# Patient Record
Sex: Female | Born: 1985 | Race: White | Hispanic: No | Marital: Married | Smoking: Never smoker
Health system: Southern US, Community
[De-identification: ages and names within clinical notes are randomized; demographics above are authoritative.]

## PROBLEM LIST (undated history)

## (undated) DIAGNOSIS — T7840XA Allergy, unspecified, initial encounter: Secondary | ICD-10-CM

## (undated) DIAGNOSIS — M549 Dorsalgia, unspecified: Secondary | ICD-10-CM

## (undated) HISTORY — DX: Allergy, unspecified, initial encounter: T78.40XA

---
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
## 2006-06-26 ENCOUNTER — Other Ambulatory Visit: Admission: RE | Admit: 2006-06-26 | Discharge: 2006-06-26 | Payer: Self-pay | Admitting: Obstetrics and Gynecology

## 2006-08-22 ENCOUNTER — Ambulatory Visit (HOSPITAL_COMMUNITY): Admission: RE | Admit: 2006-08-22 | Discharge: 2006-08-22 | Payer: Self-pay | Admitting: Obstetrics and Gynecology

## 2006-12-26 ENCOUNTER — Encounter (INDEPENDENT_AMBULATORY_CARE_PROVIDER_SITE_OTHER): Payer: Self-pay | Admitting: Obstetrics and Gynecology

## 2006-12-26 ENCOUNTER — Inpatient Hospital Stay (HOSPITAL_COMMUNITY): Admission: RE | Admit: 2006-12-26 | Discharge: 2006-12-29 | Payer: Self-pay | Admitting: Obstetrics and Gynecology

## 2007-02-06 ENCOUNTER — Other Ambulatory Visit: Admission: RE | Admit: 2007-02-06 | Discharge: 2007-02-06 | Payer: Self-pay | Admitting: Obstetrics and Gynecology

## 2010-09-04 ENCOUNTER — Emergency Department (HOSPITAL_COMMUNITY)
Admission: EM | Admit: 2010-09-04 | Discharge: 2010-09-04 | Disposition: A | Discharge status: Home / Self Care | Payer: Self-pay | Attending: Emergency Medicine | Admitting: Emergency Medicine

## 2010-09-04 DIAGNOSIS — M5380 Other specified dorsopathies, site unspecified: Secondary | ICD-10-CM | POA: Insufficient documentation

## 2010-09-04 DIAGNOSIS — T148XXA Other injury of unspecified body region, initial encounter: Secondary | ICD-10-CM | POA: Insufficient documentation

## 2010-09-04 DIAGNOSIS — X58XXXA Exposure to other specified factors, initial encounter: Secondary | ICD-10-CM | POA: Insufficient documentation

## 2010-09-04 NOTE — H&P (Signed)
Marie Gibbs, Marie Gibbs                  ACCOUNT NO.:  [id number]   MEDICAL RECORD NO.:  [id number]          PATIENT TYPE:  INP   LOCATION:  NA                            [facility]   PHYSICIAN:  James A. Ashley Royalty, M.D.DATE OF BIRTH:  Sep 04, 1985   DATE OF ADMISSION:  DATE OF DISCHARGE:                              HISTORY & PHYSICAL   HISTORY OF PRESENT ILLNESS:  This is a 25 year old prima gravida, EDC  12/16/06, currently 41 weeks 3 days gestation. Prenatal care has been  essentially uncomplicated. The patient had an ultrasound done 4/08,  revealing an estimated fetal weight of 4682 grams, 97th percentile. Most  recent cervical examination revealed her to be closed. After discussion  of the risks and benefits of vaginal delivery, including the possibility  of shoulder dystocia as well as the risk and benefits of primary  Cesarean section, the patient states a desire for primary Cesarean  section due to the LGA infant.   MEDICATIONS:  Vitamins.   PAST MEDICAL HISTORY:  Medical: Negative. Surgical: Myringotomy as a  child.   ALLERGIES:  NONE.   FAMILY HISTORY:  Noncontributory.   SOCIAL HISTORY:  The patient denies using tobacco or significant  alcohol.   REVIEW OF SYSTEMS:  Noncontributory.   PHYSICAL EXAMINATION:  GENERAL APPEARANCE: Well-developed, well-  nourished, pleasant white female in no acute distress.  VITAL SIGNS: Febrile. Vital signs stable.  CHEST: Lungs are clear.  CARDIAC: Regular rate and rhythm.  BREASTS: Exam deferred.  ABDOMEN: The abdomen is gravid with a term fundal height. Fetal heart  tones are auscultated.  MUSCULOSKELETAL: No significant edema.  PELVIC: Examination deferred.   IMPRESSION:  1. Intrauterine pregnancy at 41 weeks 3 days gestation.  2. Large for gestational age infant with an estimated fetal weight of      4682 grams on recent ultrasound.   PLAN:  Primary Cesarean section. Risks, benefits, complications and  alternatives were  fully discussed with the patient. She states she  understands and accepts. Questions invited and answered.      James A. Ashley Royalty, M.D.  Electronically Signed     JAM/MEDQ  D:  12/26/2006  T:  12/26/2006  Job:  [id number]

## 2010-09-04 NOTE — Op Note (Signed)
Marie Gibbs, Marie Gibbs                  ACCOUNT NO.:  [id number]   MEDICAL RECORD NO.:  [id number]          PATIENT TYPE:  INP   LOCATION:  9199                          [facility]ILITY[facility]  [facility]   PHYSICIAN:  James A. Ashley Royalty, M.D.DATE OF BIRTH:  05/02/1985   DATE OF PROCEDURE:  12/26/2006  DATE OF DISCHARGE:                               OPERATIVE REPORT   PREOPERATIVE DIAGNOSIS:  1. Intrauterine pregnancy at 41 weeks 3 days gestation.  2. Large for gestational age infant.   POSTOPERATIVE DIAGNOSES:  1. Intrauterine pregnancy at 41 weeks 3 days gestation.  2. Large for gestational age infant.  3. Path pending.   PROCEDURE:  Primary low transverse cesarean section.   SURGEON:  Rudy Jew. Ashley Royalty, M.D.   ANESTHESIA:  Spinal   FINDINGS:  9 pound 6 ounce female, Apgars 8 at one minute and 9 at five  minutes.  Sent to the newborn nursery   ESTIMATED BLOOD LOSS:  800 mL.   COMPLICATIONS:  None.   PACKS AND DRAINS:  Foley.   SPONGE NEEDLE AND INSTRUMENT REPORTED:  As correct x2.   DESCRIPTION OF PROCEDURE:  The patient was taken to the operating room,  and placed in the dorsal supine position after spinal anesthetic  administered.  She was prepped and draped in the usual manner for  abdominal surgery.  Foley catheter was placed.   A Pfannenstiel incision was made down to the level of the fascia which  was nicked with a knife and incised transversely with Mayo scissors.  The underlying rectus muscles were separated from the fascia using sharp  and blunt dissection.  The rectus muscles were separated in the midline  exposing the peritoneum which was elevated and entered atraumatically  with Metzenbaum scissors.  The incision was extended longitudinally.  The uterus was identified, and a bladder flap created by incising the  anterior uterine serosa and sharply and bluntly dissecting the bladder  inferiorly.  It was held in place with the bladder blade.   The uterus was entered in through a  low transverse incision using sharp  and blunt dissection.  The fluid was noted to be clear.  The infant was  delivered from the vertex presentation in an atraumatic manner.  The  infant was suctioned.  The cord was doubly clamped, cut, and the infant  given immediately to the awaiting pediatrics team.  Cord blood was  obtained; and placenta and membranes were removed in their entirety, and  submitted to pathology for histologic studies.  The uterus was  exteriorized.   The uterus was then closed in two running layers of #1 Vicryl.  The  first was a running locking layer.  The second was a running,  intermittently locking, and imbricating layer.  Hemostasis was noted.  The bladder flap was closed with 3-0 Vicryl in a running fashion.  Uterus, tubes and ovaries were inspected and found to be normal.  They  were returned to the abdominal cavity.  Copious irrigation was  accomplished.  Hemostasis was noted.   Next the peritoneum was closed with 3-0 Vicryl in  a running fashion.  The fascia was closed with #0 Vicryl in a running fashion.  The skin was  closed with staples.  The patient tolerated the procedure extremely well  and was returned to the recovery room in good condition.      James A. Ashley Royalty, M.D.  Electronically Signed     JAM/MEDQ  D:  12/26/2006  T:  12/26/2006  Job:  [id number]

## 2010-09-04 NOTE — H&P (Signed)
Marie Gibbs, Marie Gibbs                  ACCOUNT NO.:  [id number]   MEDICAL RECORD NO.:  [id number]          PATIENT TYPE:  INP   LOCATION:                                 FACILITY:   PHYSICIAN:  James A. Ashley Royalty, M.D.DATE OF BIRTH:  10/23/1985   DATE OF ADMISSION:  12/26/2006  DATE OF DISCHARGE:                              HISTORY & PHYSICAL   This is a 25 year old primigravida, EDC 12/16/06, approximately 41 weeks  2 days gestation.  Prenatal care has been essentially uncomplicated. The  patient had an ultrasound performed at [facility]   Dictation ends at this point.      James A. Ashley Royalty, M.D.     JAM/MEDQ  D:  12/26/2006  T:  12/26/2006  Job:  [id number]

## 2010-09-07 NOTE — Discharge Summary (Signed)
[name], Marie Gibbs                  ACCOUNT NO.:  [id number]   MEDICAL RECORD NO.:  [id number]          PATIENT TYPE:  INP   LOCATION:  9103                          [facility]   PHYSICIAN:  James A. Ashley Royalty, M.D.DATE OF BIRTH:  Sep 24, 1985   DATE OF ADMISSION:  12/26/2006  DATE OF DISCHARGE:  12/29/2006                               DISCHARGE SUMMARY   DISCHARGE DIAGNOSES:  1. Intrauterine pregnancy at 41 weeks 3 days gestation.  2. Large for gestational age infant.  3. Postoperative anemia - asymptomatic.   OPERATIONS/PROCEDURES:  Primary low transverse cesarean section   CONSULTATIONS:  None.   DISCHARGE MEDICATIONS:  1. Percocet.  2. Motrin.  3. Chromagen.   HISTORY/PHYSICAL:  This is a 25 year old primigravida at 41 weeks 3 days  gestation.  Prenatal care was essentially uncomplicated.  She did have  an ultrasound performed recently revealing an estimated fetal weight of  4682 grams, 97th percentile.  After discussion of the risk and benefits  of vaginal delivery versus primary cesarean section, the patient elected  to go with the cesarean section due to the paramount concerns regarding  shoulder dystocia.  For the remaining of the history and physical,  please see chart.   HOSPITAL COURSE:  The patient was admitted to [facility] in  [place].  Initial laboratory studies were drawn.  She was taken to  the operating room on December 26, 2006 and underwent primary low  transverse cesarean section.  The procedure was accomplished by Dr.  Sylvester Harder.  It yielded a 9 pound 6 ounce female.  Apgars of 8 at 1  minute and 9 at 5 minutes, and sent to the newborn nursery.  There were  no complications.  The patient's postoperative course was characterized  only by asymptomatic anemia.  The patient was felt to be a candidate for  discharge on December 29, 2006 and was discharged home afebrile and in  satisfactory condition.      James A. Ashley Royalty, M.D.  Electronically Signed     JAM/MEDQ  D:  02/24/2007  T:  02/25/2007  Job:  [id number]

## 2011-02-01 LAB — CBC
HCT: 22.7 — ABNORMAL LOW
HCT: 25.2 — ABNORMAL LOW
HCT: 36.8
Hemoglobin: 12.6
MCHC: 34.2
MCHC: 34.3
MCV: 90
MCV: 90.1
MCV: 91.4
Platelets: 131 — ABNORMAL LOW
Platelets: 134 — ABNORMAL LOW
Platelets: 163
RBC: 4.08
RDW: 14
RDW: 14.2 — ABNORMAL HIGH
WBC: 11.6 — ABNORMAL HIGH
WBC: 12.5 — ABNORMAL HIGH

## 2011-02-01 LAB — TYPE AND SCREEN: ABO/RH(D): O POS

## 2011-02-01 LAB — HEMATOCRIT: HCT: 25.5 — ABNORMAL LOW

## 2011-02-01 LAB — RPR: RPR Ser Ql: NONREACTIVE

## 2011-02-24 ENCOUNTER — Encounter: Payer: Self-pay | Admitting: *Deleted

## 2011-02-24 ENCOUNTER — Emergency Department (HOSPITAL_COMMUNITY)
Admission: EM | Admit: 2011-02-24 | Discharge: 2011-02-25 | Discharge status: Against Medical Advice | Payer: Self-pay | Attending: Emergency Medicine | Admitting: Emergency Medicine

## 2011-02-24 DIAGNOSIS — R109 Unspecified abdominal pain: Secondary | ICD-10-CM | POA: Insufficient documentation

## 2011-02-24 LAB — URINALYSIS, ROUTINE W REFLEX MICROSCOPIC
Glucose, UA: NEGATIVE mg/dL
Ketones, ur: NEGATIVE mg/dL
Protein, ur: 100 mg/dL — AB

## 2011-02-24 LAB — URINE MICROSCOPIC-ADD ON

## 2011-02-24 NOTE — ED Notes (Signed)
C/o rt flank pain since last pm nauseated no diarrhea.  Chills.  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

## 2012-10-26 ENCOUNTER — Ambulatory Visit: Payer: 59 | Admitting: Family Medicine

## 2012-10-26 VITALS — BP 118/66 | HR 89 | Temp 98.2°F | Resp 18 | Ht 71.0 in | Wt 162.4 lb

## 2012-10-26 DIAGNOSIS — J029 Acute pharyngitis, unspecified: Secondary | ICD-10-CM

## 2012-10-26 LAB — POCT RAPID STREP A (OFFICE): Rapid Strep A Screen: NEGATIVE

## 2012-10-26 MED ORDER — CEFDINIR 300 MG PO CAPS: ORAL_CAPSULE | ORAL | Status: DC

## 2012-10-26 NOTE — Progress Notes (Signed)
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  No fever or cough.  Bifrontal headache, steady with some throbbing and occasional sharp pains.  Neck is sore in the back like muscle tension.  Has tried benadryl and ibuprofen for discomfort.  Objective:  NAD Oroph:  Very red posterior pharynx without  Exudates TM's:  Normal Neck: supple, no adenop Skin: no rash Results for orders placed in visit on 10/26/12  POCT RAPID STREP A (OFFICE)      Result Value Range   Rapid Strep A Screen Negative  Negative   Assessment:  Pharyngitis  Plan:

## 2012-10-26 NOTE — Patient Instructions (Addendum)
Strep Throat  Strep throat is an infection of the throat caused by a bacteria named Streptococcus pyogenes. Your caregiver may call the infection streptococcal "tonsillitis" or "pharyngitis" depending on whether there are signs of inflammation in the tonsils or back of the throat. Strep throat is most common in children aged 27 15 years during the cold months of the year, but it can occur in people of any age during any season. This infection is spread from person to person (contagious) through coughing, sneezing, or other close contact.  SYMPTOMS   · Fever or chills.  · Painful, swollen, red tonsils or throat.  · Pain or difficulty when swallowing.  · White or yellow spots on the tonsils or throat.  · Swollen, tender lymph nodes or "glands" of the neck or under the jaw.  · Red rash all over the body (rare).  DIAGNOSIS   Many different infections can cause the same symptoms. A test must be done to confirm the diagnosis so the right treatment can be given. A "rapid strep test" can help your caregiver make the diagnosis in a few minutes. If this test is not available, a light swab of the infected area can be used for a throat culture test. If a throat culture test is done, results are usually available in a day or two.  TREATMENT   Strep throat is treated with antibiotic medicine.  HOME CARE INSTRUCTIONS   · Gargle with 1 tsp of salt in 1 cup of warm water, 3 4 times per day or as needed for comfort.  · Family members who also have a sore throat or fever should be tested for strep throat and treated with antibiotics if they have the strep infection.  · Make sure everyone in your household washes their hands well.  · Do not share food, drinking cups, or personal items that could cause the infection to spread to others.  · You may need to eat a soft food diet until your sore throat gets better.  · Drink enough water and fluids to keep your urine clear or pale yellow. This will help prevent dehydration.  · Get plenty of  rest.  · Stay home from school, daycare, or work until you have been on antibiotics for 24 hours.  · Only take over-the-counter or prescription medicines for pain, discomfort, or fever as directed by your caregiver.  · If antibiotics are prescribed, take them as directed. Finish them even if you start to feel better.  SEEK MEDICAL CARE IF:   · The glands in your neck continue to enlarge.  · You develop a rash, cough, or earache.  · You cough up green, yellow-brown, or bloody sputum.  · You have pain or discomfort not controlled by medicines.  · Your problems seem to be getting worse rather than better.  SEEK IMMEDIATE MEDICAL CARE IF:   · You develop any new symptoms such as vomiting, severe headache, stiff or painful neck, chest pain, shortness of breath, or trouble swallowing.  · You develop severe throat pain, drooling, or changes in your voice.  · You develop swelling of the neck, or the skin on the neck becomes red and tender.  · You have a fever.  · You develop signs of dehydration, such as fatigue, dry mouth, and decreased urination.  · You become increasingly sleepy, or you cannot wake up completely.  Document Released: 04/05/2000 Document Revised: 03/25/2012 Document Reviewed: 06/07/2010  Exit[employer]® Patient Information ©2014 [employer].

## 2012-10-28 LAB — CULTURE, GROUP A STREP: Organism ID, Bacteria: NORMAL

## 2014-07-07 ENCOUNTER — Ambulatory Visit (INDEPENDENT_AMBULATORY_CARE_PROVIDER_SITE_OTHER): Payer: BC Managed Care – PPO | Admitting: Emergency Medicine

## 2014-07-07 VITALS — BP 124/76 | HR 71 | Temp 98.2°F | Resp 18 | Ht 71.5 in | Wt 168.0 lb

## 2014-07-07 DIAGNOSIS — J020 Streptococcal pharyngitis: Secondary | ICD-10-CM

## 2014-07-07 MED ORDER — PENICILLIN V POTASSIUM 500 MG PO TABS: 500.0000 mg | ORAL_TABLET | Freq: Four times a day (QID) | ORAL | Status: DC

## 2014-07-07 MED ORDER — HYDROCODONE-ACETAMINOPHEN 5-325 MG PO TABS: 1.0000 | ORAL_TABLET | ORAL | Status: DC | PRN

## 2014-07-07 NOTE — Addendum Note (Signed)
Addended by: Carmelina DaneANDERSON, JEFFERY S on: 07/07/2014 01:32 PM   Modules accepted: Orders

## 2014-07-07 NOTE — Patient Instructions (Signed)

## 2014-07-07 NOTE — Progress Notes (Signed)
[facility] [address], [place] [address] [phone]  Date:  07/07/2014   Name:  Marie Gibbs   DOB:  15-Aug-1985   MRN:  [id number]  PCP:  No PCP Per Patient    Chief Complaint: Sore Throat and Neck Pain   History of Present Illness:  Marie Gibbs is a 29 y.o. very pleasant female patient who presents with the following:  Ill with sore throat and myalgias this week No fever or chills Some nasal congestion and post nasal watery drip No cough, wheezing or shortness of breath No stool change No nausea or vomiting No improvement with over the counter medications or other home remedies.  Denies other complaint or health concern today.   There are no active problems to display for this patient.   Past Medical History  Diagnosis Date  . Allergy     Past Surgical History  Procedure Laterality Date  . Cesarean section      History  Substance Use Topics  . Smoking status: Never Smoker   . Smokeless tobacco: Not on file  . Alcohol Use: 1.8 oz/week    3 Standard drinks or equivalent per week    Family History  Problem Relation Age of Onset  . Asthma Father   . Hyperlipidemia Father     No Known Allergies  Medication list has been reviewed and updated.  Current Outpatient Prescriptions on File Prior to Visit  Medication Sig Dispense Refill  . cefdinir (OMNICEF) 300 MG capsule Two capsules once a day. (Patient not taking: Reported on 07/07/2014) 20 capsule 0  . diphenhydrAMINE (BENADRYL) 25 MG tablet Take 25 mg by mouth every 6 (six) hours as needed for itching.     No current facility-administered medications on file prior to visit.    Review of Systems:  As per HPI, otherwise negative.    Physical Examination: Filed Vitals:   07/07/14 1228  BP: 124/76  Pulse: 71  Temp: 98.2 F (36.8 C)  Resp: 18   Filed Vitals:   07/07/14 1228  Height: 5' 11.5" (1.816 m)  Weight: 168 lb (76.204 kg)   Body mass index is 23.11  kg/(m^2). Ideal Body Weight: Weight in (lb) to have BMI = 25: 181.4  GEN: WDWN, NAD, Non-toxic, A & O x 3 HEENT: Atraumatic, Normocephalic. Neck supple. No masses, anterior LAD.  Exudative pharyngitis Ears and Nose: No external deformity. CV: RRR, No M/G/R. No JVD. No thrill. No extra heart sounds. PULM: CTA B, no wheezes, crackles, rhonchi. No retractions. No resp. distress. No accessory muscle use. ABD: S, NT, ND, +BS. No rebound. No HSM. EXTR: No c/c/e NEURO Normal gait.  PSYCH: Normally interactive. Conversant. Not depressed or anxious appearing.  Calm demeanor.    Assessment and Plan: Strep Penicillin  Signed,  Phillips OdorJeffery Eulogia Dismore, MD

## 2019-05-17 ENCOUNTER — Other Ambulatory Visit: Payer: Self-pay | Admitting: Adult Medicine

## 2019-05-17 DIAGNOSIS — N6310 Unspecified lump in the right breast, unspecified quadrant: Secondary | ICD-10-CM

## 2019-05-28 ENCOUNTER — Other Ambulatory Visit: Payer: Self-pay | Admitting: Adult Medicine

## 2019-05-28 ENCOUNTER — Other Ambulatory Visit: Payer: Self-pay

## 2019-05-28 ENCOUNTER — Ambulatory Visit
Admission: RE | Admit: 2019-05-28 | Discharge: 2019-05-28 | Disposition: A | Discharge status: Home / Self Care | Payer: BC Managed Care – PPO | Source: Ambulatory Visit | Attending: Adult Medicine | Admitting: Adult Medicine

## 2019-05-28 DIAGNOSIS — R921 Mammographic calcification found on diagnostic imaging of breast: Secondary | ICD-10-CM

## 2019-05-28 DIAGNOSIS — N6489 Other specified disorders of breast: Secondary | ICD-10-CM

## 2019-05-28 DIAGNOSIS — N6310 Unspecified lump in the right breast, unspecified quadrant: Secondary | ICD-10-CM

## 2019-11-26 ENCOUNTER — Ambulatory Visit
Admission: RE | Admit: 2019-11-26 | Discharge: 2019-11-26 | Disposition: A | Discharge status: Home / Self Care | Payer: BC Managed Care – PPO | Source: Ambulatory Visit | Attending: Adult Medicine | Admitting: Adult Medicine

## 2019-11-26 ENCOUNTER — Other Ambulatory Visit: Payer: Self-pay | Admitting: Adult Medicine

## 2019-11-26 ENCOUNTER — Other Ambulatory Visit: Payer: Self-pay

## 2019-11-26 DIAGNOSIS — N6489 Other specified disorders of breast: Secondary | ICD-10-CM

## 2019-11-26 DIAGNOSIS — R921 Mammographic calcification found on diagnostic imaging of breast: Secondary | ICD-10-CM

## 2020-06-06 ENCOUNTER — Ambulatory Visit
Admission: RE | Admit: 2020-06-06 | Discharge: 2020-06-06 | Disposition: A | Discharge status: Home / Self Care | Payer: BC Managed Care – PPO | Source: Ambulatory Visit | Attending: Adult Medicine | Admitting: Adult Medicine

## 2020-06-06 ENCOUNTER — Other Ambulatory Visit: Payer: Self-pay

## 2020-06-06 DIAGNOSIS — N6489 Other specified disorders of breast: Secondary | ICD-10-CM

## 2020-06-06 DIAGNOSIS — R921 Mammographic calcification found on diagnostic imaging of breast: Secondary | ICD-10-CM

## 2021-08-02 IMAGING — MG DIGITAL DIAGNOSTIC BILAT W/ TOMO W/ CAD
8 of 17 series · 8 of 40 positions shown · non-contrast
Comparison: Previous exam(s).

CLINICAL DATA: The patient presents with a palpable lump in the
right breast. Baseline mammography.

EXAM:
DIGITAL DIAGNOSTIC BILATERAL MAMMOGRAM WITH CAD AND TOMO
ULTRASOUND RIGHT BREAST

[L CC]
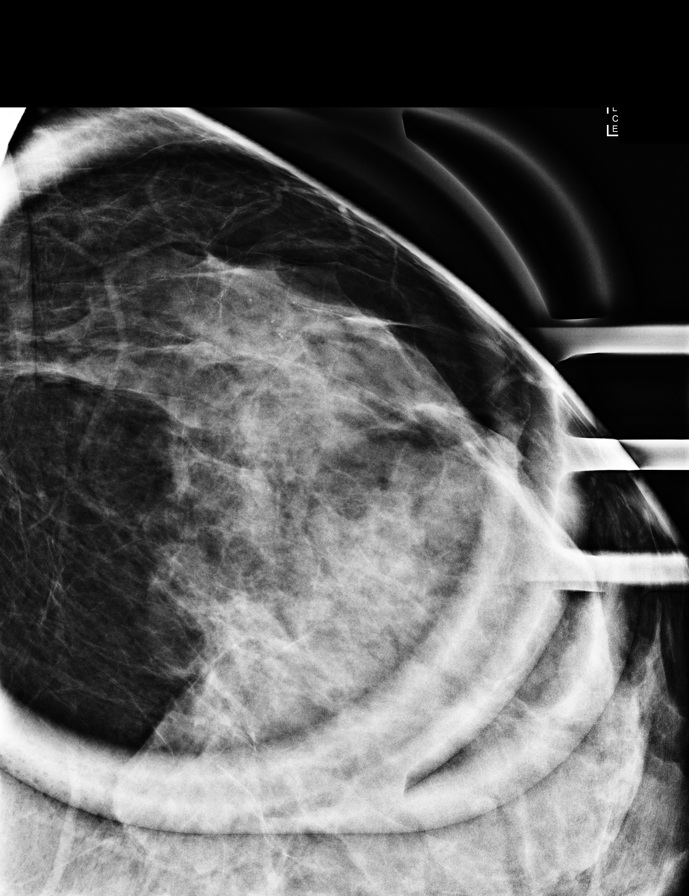

[L ML]
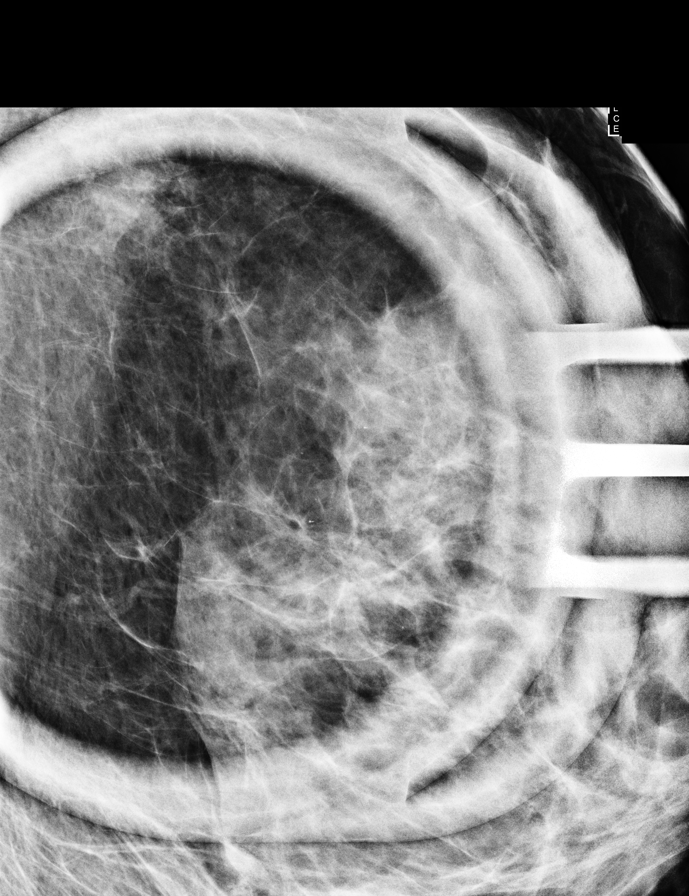

[R MLO synth-2D]
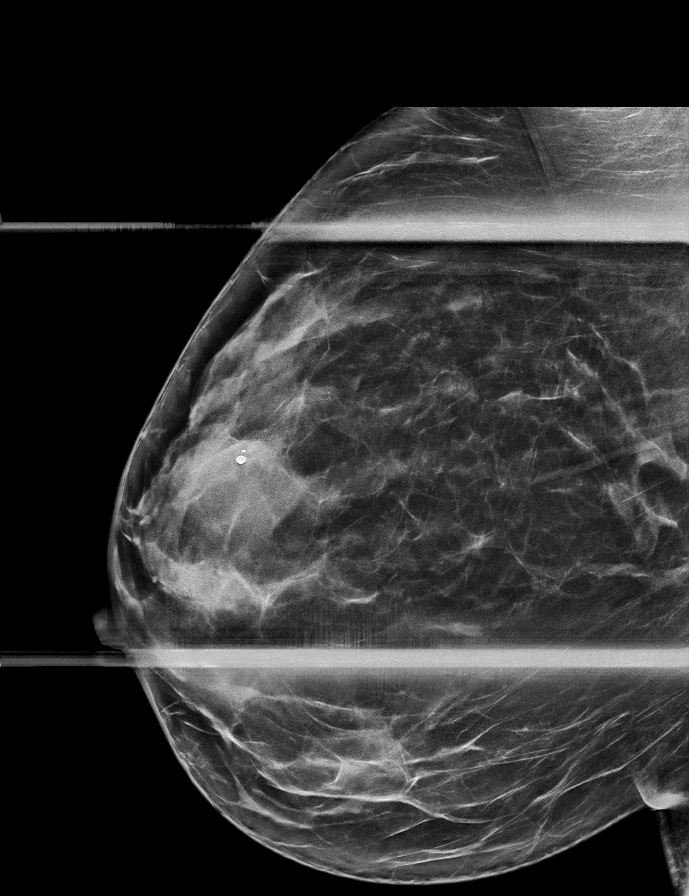

[L MLO synth-2D (1 of 2)]
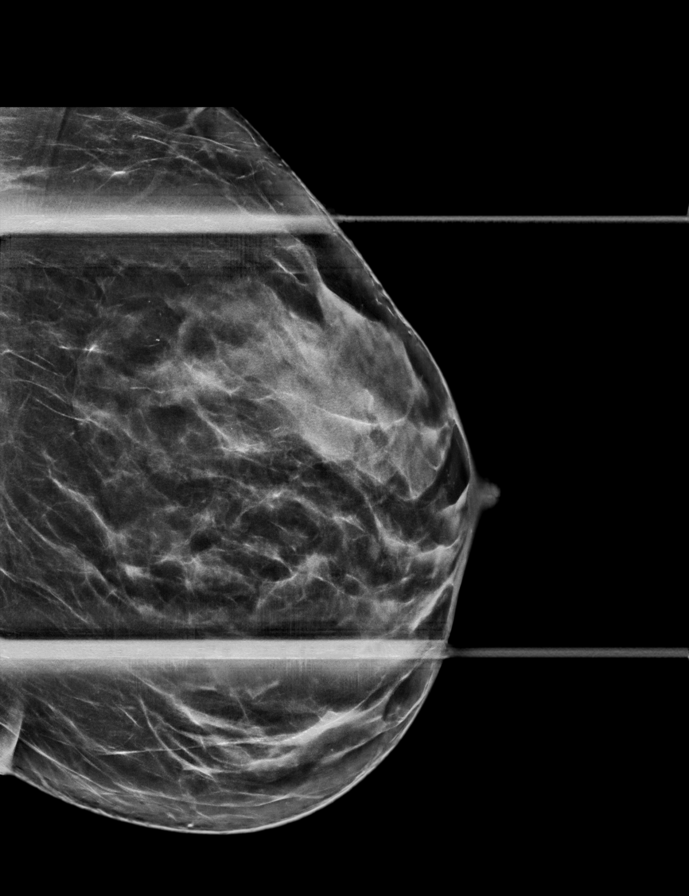

[R CC synth-2D]
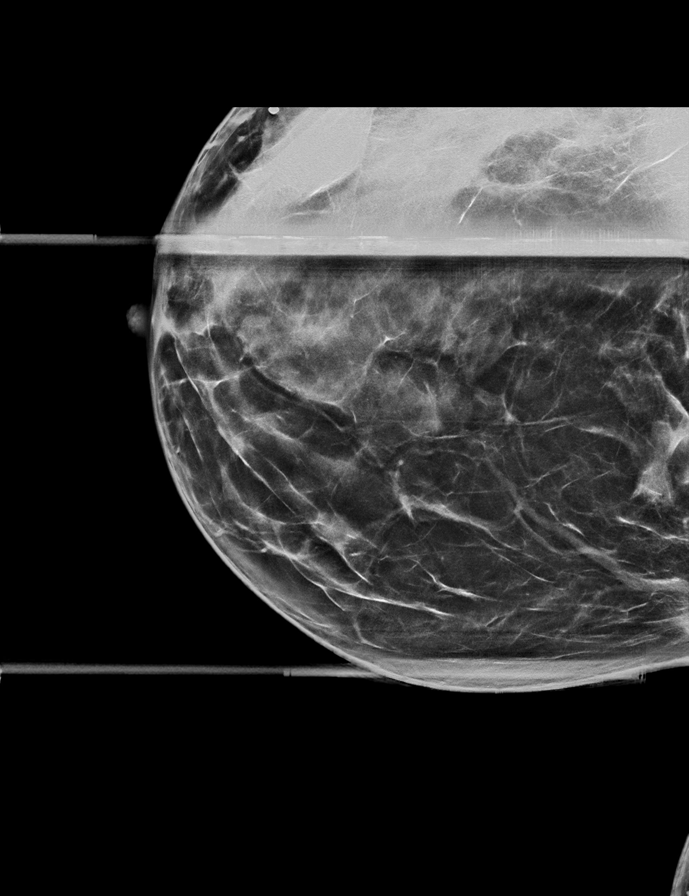

[L MLO synth-2D (2 of 2)]
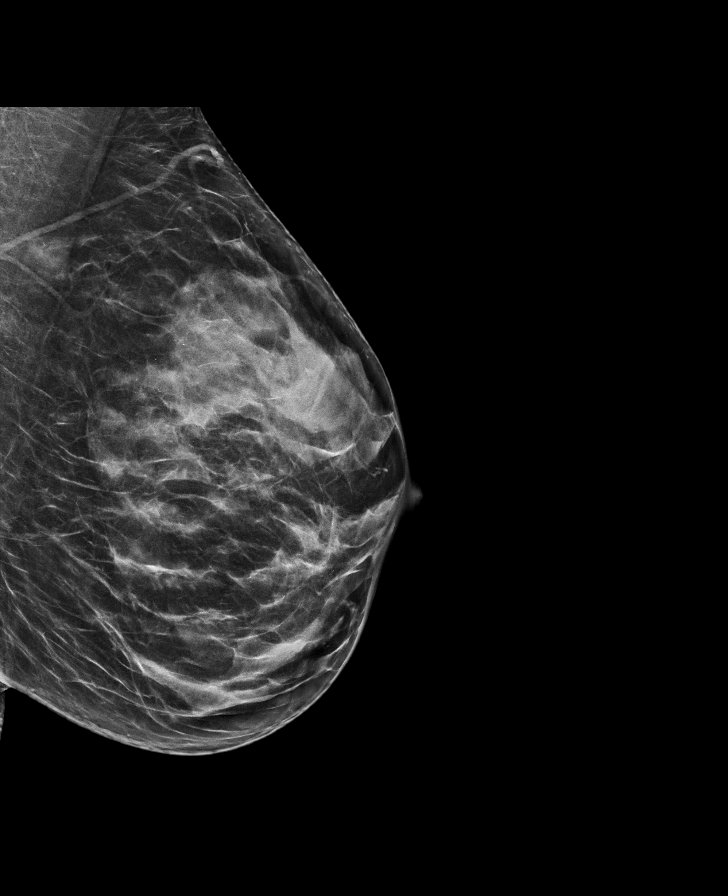

[L CC synth-2D]
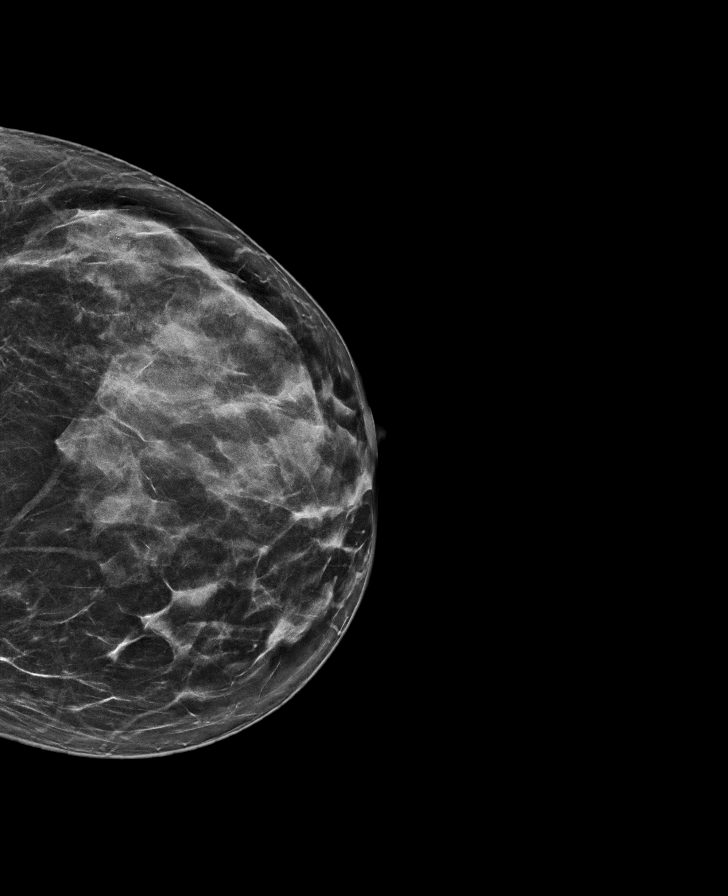

[R TAN synth-2D]
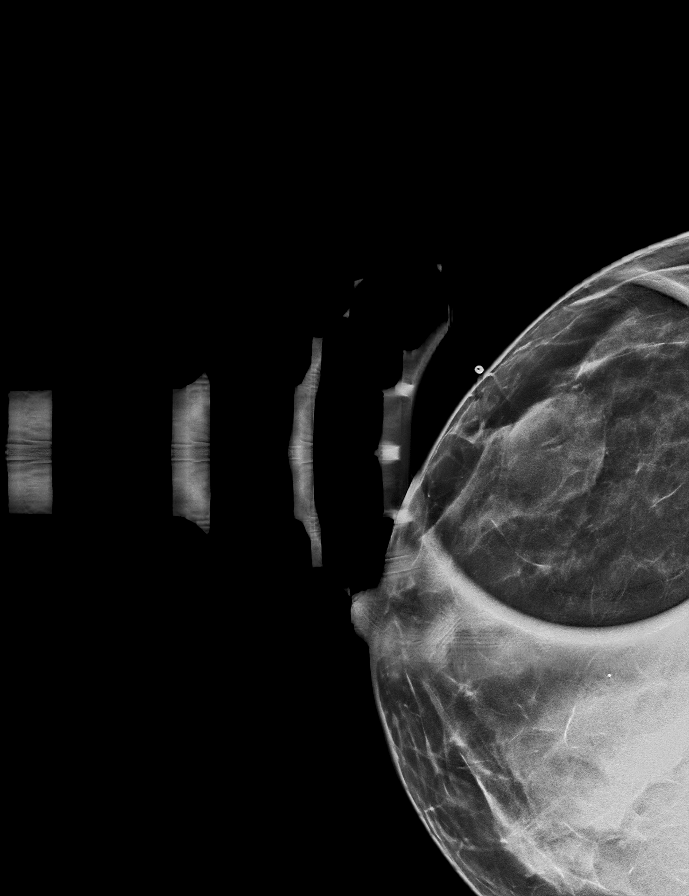

[8 of 40 positions shown; findings below may reference images not displayed]

ACR Breast Density Category c: The breast tissue is heterogeneously
dense, which may obscure small masses.
FINDINGS: There is an oval isodense mass in the region of the patient's
palpable lump in the lateral right breast. There is a focal
asymmetry in the medial central to superior right breast at a
posterior depth which has the appearance of glandular tissue on spot
imaging. There is a group of probably benign calcifications in the
upper-outer left breast which are round and punctate and only
loosely grouped on the MLO view. No other suspicious mammographic
findings are identified.

Mammographic images were processed with CAD.

On physical exam, there is a palpable lump in the right breast.

Targeted ultrasound is performed, showing a simple cyst in the
region of the patient's right palpable lump. There is an island of
dense glandular tissue at 1 o'clock which likely accounts for the
right-sided focal asymmetry.
IMPRESSION: The patient is palpating a simple cyst on the right. There is a
probably benign right breast focal asymmetry and probably benign
left breast calcifications.

RECOMMENDATION:
Recommend six-month follow-up mammography of both breast for the
right sided focal asymmetry in the left-sided calcifications.

I have discussed the findings and recommendations with the patient.
If applicable, a reminder letter will be sent to the patient
regarding the next appointment.

BI-RADS CATEGORY  3: Probably benign.

## 2021-09-03 ENCOUNTER — Other Ambulatory Visit: Payer: Self-pay | Admitting: Adult Medicine

## 2021-09-03 DIAGNOSIS — Z1231 Encounter for screening mammogram for malignant neoplasm of breast: Secondary | ICD-10-CM

## 2021-09-04 ENCOUNTER — Other Ambulatory Visit: Payer: Self-pay | Admitting: Adult Medicine

## 2021-09-04 DIAGNOSIS — Z09 Encounter for follow-up examination after completed treatment for conditions other than malignant neoplasm: Secondary | ICD-10-CM

## 2022-10-14 ENCOUNTER — Other Ambulatory Visit: Payer: Self-pay

## 2022-10-14 ENCOUNTER — Emergency Department (HOSPITAL_BASED_OUTPATIENT_CLINIC_OR_DEPARTMENT_OTHER)
Admission: EM | Admit: 2022-10-14 | Discharge: 2022-10-15 | Disposition: A | Discharge status: Home / Self Care | Payer: 59 | Attending: Emergency Medicine | Admitting: Emergency Medicine

## 2022-10-14 ENCOUNTER — Encounter (HOSPITAL_BASED_OUTPATIENT_CLINIC_OR_DEPARTMENT_OTHER): Payer: Self-pay | Admitting: Emergency Medicine

## 2022-10-14 DIAGNOSIS — M545 Low back pain, unspecified: Secondary | ICD-10-CM | POA: Diagnosis not present

## 2022-10-14 DIAGNOSIS — M549 Dorsalgia, unspecified: Secondary | ICD-10-CM | POA: Diagnosis present

## 2022-10-14 HISTORY — DX: Dorsalgia, unspecified: M54.9

## 2022-10-14 NOTE — ED Triage Notes (Signed)
Patient arrived via EMS c/o lower back pain x 2 days. Patient states "feeling pop" when bending over to grab a skirt. Patient able to move all extremities, but states "it being painful". Patient is AO x 4, VS WDL, unable to stand at this time.

## 2022-10-15 MED ORDER — HYDROMORPHONE HCL 1 MG/ML IJ SOLN
2.0000 mg | Freq: Once | INTRAMUSCULAR | Status: AC
  Administered 2022-10-15: 2 mg via INTRAMUSCULAR
  Filled 2022-10-15: qty 2

## 2022-10-15 MED ORDER — CYCLOBENZAPRINE HCL 10 MG PO TABS: 10.0000 mg | ORAL_TABLET | Freq: Three times a day (TID) | ORAL | 0 refills | Status: AC | PRN

## 2022-10-15 MED ORDER — ONDANSETRON 4 MG PO TBDP
8.0000 mg | ORAL_TABLET | Freq: Once | ORAL | Status: AC
  Administered 2022-10-15: 8 mg via ORAL
  Filled 2022-10-15: qty 2

## 2022-10-15 MED ORDER — CYCLOBENZAPRINE HCL 10 MG PO TABS
10.0000 mg | ORAL_TABLET | Freq: Once | ORAL | Status: AC
  Administered 2022-10-15: 10 mg via ORAL
  Filled 2022-10-15: qty 1

## 2022-10-15 MED ORDER — HYDROCODONE-ACETAMINOPHEN 5-325 MG PO TABS
2.0000 | ORAL_TABLET | Freq: Once | ORAL | Status: AC
  Administered 2022-10-15: 2 via ORAL
  Filled 2022-10-15: qty 2

## 2022-10-15 MED ORDER — HYDROCODONE-ACETAMINOPHEN 5-325 MG PO TABS: 1.0000 | ORAL_TABLET | ORAL | 0 refills | Status: AC | PRN

## 2022-10-15 MED ORDER — NAPROXEN 375 MG PO TABS: ORAL_TABLET | ORAL | 0 refills | Status: AC

## 2022-10-15 NOTE — ED Provider Notes (Signed)
[facility] Provider Note: Marie Dell, MD, FACEP  CSN: [id number] MRN: [id number] ARRIVAL: 10/14/22 at 2328 ROOM: MH05/[facility]05   CHIEF COMPLAINT  Back Pain   HISTORY OF PRESENT ILLNESS  10/15/22 12:10 AM Marie Gibbs is a 37 y.o. female with right-sided back pain for 2 days.  She felt a pop when she bent over, and has had pain since then.  She is able to move her legs but it is painful to do so.  She rates her pain as a 10 out of 10 and describes it as feeling like a spasm.  Ambulation is difficult due to pain.  She denies bowel or bladder changes.   Past Medical History:  Diagnosis Date   Allergy    Back pain     Past Surgical History:  Procedure Laterality Date   CESAREAN SECTION      Family History  Problem Relation Age of Onset   Asthma Father    Hyperlipidemia Father    Breast cancer Paternal Aunt     Social History   Tobacco Use   Smoking status: Never  Vaping Use   Vaping Use: Never used  Substance Use Topics   Alcohol use: Yes    Alcohol/week: 3.0 standard drinks of alcohol    Types: 3 Standard drinks or equivalent per week   Drug use: No    Prior to Admission medications   Medication Sig Start Date End Date Taking? Authorizing Provider  cyclobenzaprine (FLEXERIL) 10 MG tablet Take 1 tablet (10 mg total) by mouth 3 (three) times daily as needed for muscle spasms. 10/15/22  Yes Kaylea Mounsey, MD  HYDROcodone-acetaminophen (NORCO) 5-325 MG tablet Take 1 tablet by mouth every 4 (four) hours as needed for severe pain. 10/15/22  Yes Paiden Cavell, MD  naproxen (NAPROSYN) 375 MG tablet Take 1 tablet twice daily as needed for back pain. 10/15/22  Yes Alydia Gosser, MD  diphenhydrAMINE (BENADRYL) 25 MG tablet Take 25 mg by mouth every 6 (six) hours as needed for itching.    [provider]  etonogestrel (NEXPLANON) 68 MG IMPL implant 1 each by Subdermal route once.    [provider]    Allergies Oxycodone-acetaminophen   REVIEW  OF SYSTEMS  Negative except as noted here or in the History of Present Illness.   PHYSICAL EXAMINATION  Initial Vital Signs Blood pressure (!) 146/86, pulse 88, temperature 98 F (36.7 C), temperature source Oral, resp. rate 18, height 5\' 11"  (1.803 m), weight 74.8 kg, last menstrual period 10/14/2022, SpO2 100 %.  Examination General: Well-developed, well-nourished female in no acute distress; appearance consistent with age of record HENT: normocephalic; atraumatic Eyes: Normal appearance Neck: supple Heart: regular rate and rhythm Lungs: clear to auscultation bilaterally Abdomen: soft; nondistended; nontender; bowel sounds present Back: Right paralumbar tenderness; positive straight leg raise on the right at about 15 degrees Extremities: No deformity; full range of motion; pulses normal Neurologic: Awake, alert and oriented; motor function intact in all extremities and symmetric but examination of right lower extremity limited due to pain; sensation intact and symmetric in lower extremities; no facial droop Skin: Warm and dry Psychiatric: Normal mood and affect   RESULTS  Summary of this visit's results, reviewed and interpreted by myself:   EKG Interpretation  Date/Time:    Ventricular Rate:    PR Interval:    QRS Duration:   QT Interval:    QTC Calculation:   R Axis:     Text Interpretation:  Laboratory Studies: No results found for this or any previous visit (from the past 24 hour(s)). Imaging Studies: No results found.  ED COURSE and MDM  Nursing notes, initial and subsequent vitals signs, including pulse oximetry, reviewed and interpreted by myself.  Vitals:   10/14/22 2340 10/14/22 2348  BP:  (!) 146/86  Pulse: (!) 59 88  Resp: 18 18  Temp: 98 F (36.7 C) 98 F (36.7 C)  TempSrc: Oral Oral  SpO2: 100% 100%  Weight: 74.8 kg   Height: 5\' 11"  (1.803 m)    Medications  HYDROcodone-acetaminophen (NORCO/VICODIN) 5-325 MG per tablet 2 tablet (has  no administration in time range)  cyclobenzaprine (FLEXERIL) tablet 10 mg (has no administration in time range)    The patient has acute low back pain.  She does not have a mechanism concerning for fracture.  We will treat her pain and spasms and refer to neurosurgery should symptoms persist.  PROCEDURES  Procedures   ED DIAGNOSES     ICD-10-CM   1. Acute right-sided low back pain without sciatica  M54.50          Valoria Tamburri, Jonny Ruiz, MD 10/15/22 0021

## 2022-10-15 NOTE — ED Notes (Addendum)
Pt able to move all 4 extremities independently. Pt reports that she is unable to bear weight at this time due to pain.  Rn informed pt we will monitor her for a period of time to ensure adequate pain relief so that she may stand. EDP [place] aware and agreeable.
